# Patient Record
Sex: Male | Born: 1970
Health system: Southern US, Community
[De-identification: ages and names within clinical notes are randomized; demographics above are authoritative.]

## PROBLEM LIST (undated history)

## (undated) DIAGNOSIS — M545 Low back pain, unspecified: Secondary | ICD-10-CM

## (undated) DIAGNOSIS — Z789 Other specified health status: Secondary | ICD-10-CM

## (undated) DIAGNOSIS — R7989 Other specified abnormal findings of blood chemistry: Secondary | ICD-10-CM

## (undated) DIAGNOSIS — M21619 Bunion of unspecified foot: Secondary | ICD-10-CM

## (undated) DIAGNOSIS — M109 Gout, unspecified: Secondary | ICD-10-CM

## (undated) DIAGNOSIS — E669 Obesity, unspecified: Secondary | ICD-10-CM

## (undated) HISTORY — DX: Low back pain, unspecified: M54.50

## (undated) HISTORY — DX: Gout, unspecified: M10.9

## (undated) HISTORY — DX: Bunion of unspecified foot: M21.619

## (undated) HISTORY — DX: Obesity, unspecified: E66.9

## (undated) HISTORY — PX: KNEE SURGERY: SHX244

## (undated) HISTORY — DX: Other specified abnormal findings of blood chemistry: R79.89

---
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
## 1997-12-07 ENCOUNTER — Emergency Department (HOSPITAL_COMMUNITY): Admission: EM | Admit: 1997-12-07 | Discharge: 1997-12-07 | Payer: Self-pay | Admitting: Emergency Medicine

## 2016-06-30 DIAGNOSIS — Z0000 Encounter for general adult medical examination without abnormal findings: Secondary | ICD-10-CM | POA: Diagnosis not present

## 2016-07-11 DIAGNOSIS — M109 Gout, unspecified: Secondary | ICD-10-CM | POA: Diagnosis not present

## 2016-07-11 DIAGNOSIS — Z1389 Encounter for screening for other disorder: Secondary | ICD-10-CM | POA: Diagnosis not present

## 2016-07-11 DIAGNOSIS — Z0000 Encounter for general adult medical examination without abnormal findings: Secondary | ICD-10-CM | POA: Diagnosis not present

## 2017-01-02 DIAGNOSIS — H10502 Unspecified blepharoconjunctivitis, left eye: Secondary | ICD-10-CM | POA: Diagnosis not present

## 2017-08-14 DIAGNOSIS — R82998 Other abnormal findings in urine: Secondary | ICD-10-CM | POA: Diagnosis not present

## 2017-08-14 DIAGNOSIS — Z0000 Encounter for general adult medical examination without abnormal findings: Secondary | ICD-10-CM | POA: Diagnosis not present

## 2017-08-25 DIAGNOSIS — Z0000 Encounter for general adult medical examination without abnormal findings: Secondary | ICD-10-CM | POA: Diagnosis not present

## 2017-08-25 DIAGNOSIS — M109 Gout, unspecified: Secondary | ICD-10-CM | POA: Diagnosis not present

## 2017-08-25 DIAGNOSIS — Z125 Encounter for screening for malignant neoplasm of prostate: Secondary | ICD-10-CM | POA: Diagnosis not present

## 2017-08-25 DIAGNOSIS — Z1389 Encounter for screening for other disorder: Secondary | ICD-10-CM | POA: Diagnosis not present

## 2017-08-29 DIAGNOSIS — Z1212 Encounter for screening for malignant neoplasm of rectum: Secondary | ICD-10-CM | POA: Diagnosis not present

## 2019-03-07 ENCOUNTER — Emergency Department (HOSPITAL_COMMUNITY): Payer: 59

## 2019-03-07 ENCOUNTER — Encounter (HOSPITAL_COMMUNITY): Payer: Self-pay | Admitting: Emergency Medicine

## 2019-03-07 ENCOUNTER — Other Ambulatory Visit: Payer: Self-pay

## 2019-03-07 ENCOUNTER — Emergency Department (HOSPITAL_COMMUNITY)
Admission: EM | Admit: 2019-03-07 | Discharge: 2019-03-07 | Disposition: A | Discharge status: Home / Self Care | Payer: 59 | Attending: Emergency Medicine | Admitting: Emergency Medicine

## 2019-03-07 DIAGNOSIS — R200 Anesthesia of skin: Secondary | ICD-10-CM | POA: Diagnosis not present

## 2019-03-07 HISTORY — DX: Other specified health status: Z78.9

## 2019-03-07 LAB — CBC
HCT: 47.8 % (ref 39.0–52.0)
Hemoglobin: 16.1 g/dL (ref 13.0–17.0)
MCH: 31.7 pg (ref 26.0–34.0)
MCHC: 33.7 g/dL (ref 30.0–36.0)
MCV: 94.1 fL (ref 80.0–100.0)
Platelets: 266 10*3/uL (ref 150–400)
RBC: 5.08 MIL/uL (ref 4.22–5.81)
RDW: 12.2 % (ref 11.5–15.5)
WBC: 5.2 10*3/uL (ref 4.0–10.5)
nRBC: 0 % (ref 0.0–0.2)

## 2019-03-07 LAB — BASIC METABOLIC PANEL
Anion gap: 12 (ref 5–15)
BUN: 14 mg/dL (ref 6–20)
CO2: 27 mmol/L (ref 22–32)
Calcium: 9.6 mg/dL (ref 8.9–10.3)
Chloride: 103 mmol/L (ref 98–111)
Creatinine, Ser: 0.98 mg/dL (ref 0.61–1.24)
GFR calc Af Amer: >60 mL/min (ref >60)
GFR calc non Af Amer: >60 mL/min (ref >60)
Glucose, Bld: 89 mg/dL (ref 70–99)
Potassium: 4 mmol/L (ref 3.5–5.1)
Sodium: 142 mmol/L (ref 135–145)

## 2019-03-07 NOTE — ED Provider Notes (Signed)
I have personally seen and examined the patient. I have reviewed the documentation on PMH/FH/Soc Hx. I have discussed the plan of care with the resident and patient.  I have reviewed and agree with the resident's documentation. Please see associated encounter note.  Briefly, the patient is a 48 y.o. male here with left-sided facial numbness since yesterday.  Otherwise normal neurological exam.  Unremarkable vitals.  No significant medical history.  Does not take any medicine.  Does not have any stroke risk factors.  EKG shows sinus rhythm.  T wave versions inferiorly but no prior to compare to.  Does not have any chest pain.  Has a left-sided numbness in the lower face otherwise unremarkable exam.  Does not appear to be Bell's palsy.  No weakness.  No speech changes.  Lab work showed no significant anemia, electrolyte abnormality, kidney injury.  Head CT is unremarkable.  Will get MRI to rule out stroke.  Could be referred pain from tooth or from ear possibly.  Dispo per MRI.  This chart was dictated using voice recognition software.  Despite best efforts to proofread,  errors can occur which can change the documentation meaning.     EKG Interpretation  Date/Time:  Thursday March 07 2019 14:26:34 EST Ventricular Rate:  116 PR Interval:  148 QRS Duration: 90 QT Interval:  306 QTC Calculation: 425 R Axis:   100 Text Interpretation: Sinus tachycardia Rightward axis ST & T wave abnormality, consider inferolateral ischemia Abnormal ECG Confirmed by Lennice Sites [phone]) on 03/07/2019 3:28:56 PM         Lennice Sites, DO 03/07/19 1912

## 2019-03-07 NOTE — ED Notes (Signed)
Patient transported to MRI 

## 2019-03-07 NOTE — ED Provider Notes (Signed)
[facility] Provider Note   CSN: [id number] Arrival date & time: 03/07/19  1407     History Chief Complaint  Patient presents with  . Numbness    Jamie Parks is a 48 y.o. male.  HPI Pt is a 48 year old male with PMH of gout who presents to the ED with concern for left facial numbness.  Patient reports yesterday afternoon Jamie Parks was driving his car when Jamie Parks began to notice numbness in the left lower part of his face.  Patient denies any headache or pain associated with this.  No recent falls or trauma.  No recent dental procedures.  Patient has never had symptoms like this before.  Jamie Parks denies any vision changes.  Patient denies any acute speech difficulty.  No changes in taste or swallowing.  No ear pain.  Patient denies any numbness or weakness of his arms or legs.  No dizziness.  Patient states it feels as if Jamie Parks had a Novocain shot of the left side of his face start around the level of his ear and going down.  No prior history of stroke.  Patient has any chest pain or palpitations.  No difficulty breathing or recent illness.  No family history of stroke.  Patient does not smoke.  Past Medical History:  Diagnosis Date  . No pertinent past medical history     There are no problems to display for this patient.   History reviewed. No pertinent surgical history.     No family history on file.  Social History   Tobacco Use  . Smoking status: Not on file  Substance Use Topics  . Alcohol use: Not on file  . Drug use: Not on file    Home Medications Prior to Admission medications   Not on File    Allergies    Patient has no allergy information on record.  Review of Systems   Review of Systems  Constitutional: Negative for chills and fever.  HENT: Negative for dental problem, drooling, ear pain, mouth sores, sore throat, trouble swallowing and voice change.   Eyes: Negative for pain and visual disturbance.  Respiratory: Negative for  cough and shortness of breath.   Cardiovascular: Negative for chest pain and palpitations.  Gastrointestinal: Negative for abdominal pain and vomiting.  Genitourinary: Negative for dysuria and hematuria.  Musculoskeletal: Negative for arthralgias and back pain.  Skin: Negative for color change and rash.  Neurological: Positive for numbness. Negative for dizziness, seizures, syncope, weakness and headaches.  Psychiatric/Behavioral: Negative for agitation and behavioral problems.  All other systems reviewed and are negative.   Physical Exam Updated Vital Signs BP (!) 149/94 (BP Location: Right Arm)   Pulse (!) 103   Temp 98.5 F (36.9 C) (Oral)   Resp 16   Ht 6' 2.5" (1.892 m)   Wt 97.5 kg   SpO2 100%   BMI 27.24 kg/m   Physical Exam Vitals and nursing note reviewed.  Constitutional:      Appearance: Jamie Parks is well-developed.  HENT:     Head: Normocephalic and atraumatic.     Mouth/Throat:     Mouth: Mucous membranes are moist.     Pharynx: Oropharynx is clear. No oropharyngeal exudate or posterior oropharyngeal erythema.  Eyes:     Extraocular Movements: Extraocular movements intact.     Conjunctiva/sclera: Conjunctivae normal.     Pupils: Pupils are equal, round, and reactive to light.  Cardiovascular:     Rate and Rhythm: Normal  rate and regular rhythm.     Heart sounds: No murmur.  Pulmonary:     Effort: Pulmonary effort is normal. No respiratory distress.     Breath sounds: Normal breath sounds.  Abdominal:     Palpations: Abdomen is soft.     Tenderness: There is no abdominal tenderness.  Musculoskeletal:        General: Normal range of motion.     Cervical back: Neck supple.  Skin:    General: Skin is warm and dry.  Neurological:     Mental Status: Jamie Parks is alert and oriented to person, place, and time.     Sensory: Sensory deficit present.     Motor: No weakness.     Coordination: Coordination normal.     Gait: Gait normal.     Comments: Normal speech and  attention Visual fields intact  No facial asymmetry or weakness Decreased sensation to light touch to left lower face in V2-V3 distribution 5/5 strength throughout Gross sensation of extremities intact Ambulatory without difficulty   Psychiatric:        Mood and Affect: Mood normal.        Behavior: Behavior normal.     ED Results / Procedures / Treatments   Labs (all labs ordered are listed, but only abnormal results are displayed) Labs Reviewed  BASIC METABOLIC PANEL  CBC    EKG EKG Interpretation  Date/Time:  Thursday March 07 2019 14:26:34 EST Ventricular Rate:  116 PR Interval:  148 QRS Duration: 90 QT Interval:  306 QTC Calculation: 425 R Axis:   100 Text Interpretation: Sinus tachycardia Rightward axis ST & T wave abnormality, consider inferolateral ischemia Abnormal ECG Confirmed by Lennice Sites [phone]) on 03/07/2019 3:28:56 PM   Radiology CT Head Wo Contrast  Result Date: 03/07/2019 CLINICAL DATA:  Left-sided facial numbness onset last night EXAM: CT HEAD WITHOUT CONTRAST TECHNIQUE: Contiguous axial images were obtained from the base of the skull through the vertex without intravenous contrast. COMPARISON:  None. FINDINGS: Brain: No evidence of acute infarction, hemorrhage, hydrocephalus, extra-axial collection or mass lesion/mass effect. Vascular: No hyperdense vessel or unexpected calcification. Skull: Normal. Negative for fracture or focal lesion. Sinuses/Orbits: There is a partially imaged mucoid retention cyst of the right maxillary sinus. Total opacification of the right frontal sinus. Other: None. IMPRESSION: 1.  No acute intracranial pathology. 2. There is a partially imaged mucoid retention cyst of the right maxillary sinus. Total opacification of the right frontal sinus. Correlate for sinusitis. Electronically Signed   By: Eddie Candle M.D.   On: 03/07/2019 16:34   MR BRAIN WO CONTRAST  Result Date: 03/07/2019 CLINICAL DATA:  Numbness, tingling and  paresthesia is affecting the left face beginning yesterday. EXAM: MRI HEAD WITHOUT CONTRAST TECHNIQUE: Multiplanar, multiecho pulse sequences of the brain and surrounding structures were obtained without intravenous contrast. COMPARISON:  CT 03/07/2019 FINDINGS: Brain: The brain has a normal appearance without evidence of malformation, atrophy, old or acute small or large vessel infarction, mass lesion, hemorrhage, hydrocephalus or extra-axial collection. Vascular: Major vessels at the base of the brain show flow. Venous sinuses appear patent. Skull and upper cervical spine: Normal. Sinuses/Orbits: Opacification of the right division of the frontal sinus. Retention cyst in the right maxillary sinus. No fluid seen in the middle ears or mastoids. Orbits negative. Other: Asymmetric appearance of the left petrous apex which could represent either chronic sterile fluid collection or a cholesterol granuloma. The appearance is not likely to represent acute inflammatory disease. IMPRESSION: Normal  appearance of the brain itself. Likely incidental opacification of the right division of the frontal sinus and right maxillary sinus retention cyst. Asymmetric appearance of the petrous apex on the left which could be due to some chronic fluid accumulation or could represent a cholesterol granuloma. The appearance is not likely to represent acute inflammatory disease. This should not have any affect upon the left facial nerve. Electronically Signed   By: Nelson Chimes M.D.   On: 03/07/2019 20:56    Procedures Procedures (including critical care time)  Medications Ordered in ED Medications - No data to display  ED Course  I have reviewed the triage vital signs and the nursing notes.  Pertinent labs & imaging results that were available during my care of the patient were reviewed by me and considered in my medical decision making (see chart for details).    MDM Rules/Calculators/A&P On arrival, pt is afebrile,  hypertensive.   Patient is well-appearing on initial assessment.  Jamie Parks is ambulatory thought difficulty.  Patient does have decreased sensation of his left lower face in a V2/V3 distribution.  Intact sensation of upper face.  No evidence of facial asymmetry or weakness.  No other gross neuro deficits.  No visual deficits.  EKG: Sinus tachycardia with T wave inversions in inferior-lateral leads, no prior to compare   Considered: Peripheral cause such as peripheral lesion, vestibular schwannoma, Bell's palsy (no weakness or obvious CNVII involvement),TIA/CVA; less likely trigeminal neuralgia as patient does not have any associated pain.  Patient does have crown of his left inferior posterior molar without any surrounding swelling or erythema, denies any dental pain. Patient denies any ear pain or pain in general to suggest zoster.  CT head without acute findings MR brain: No evidence of infarct/mass/lesion; ? Cholesterol granuloma of petrous apex on left  Upon reassessment, patient continues to be well-appearing with isolated decrease sensation/paresthesias of his left.  Unclear etiology but given the above work-up, feel that patient is stable for outpatient follow-up.  Encouraged to follow-up with his PCP as well as his dentist.  Patient voices understanding.  Strict return precautions given.   Final Clinical Impression(s) / ED Diagnoses Final diagnoses:  Left facial numbness    Rx / DC Orders ED Discharge Orders    None       Burns Spain, MD 03/08/19 [place], [place], DO 03/10/19 [phone]

## 2019-03-07 NOTE — ED Triage Notes (Signed)
Pt reports driving yesterday and noticed the left side of his face feels like he has novocain in his cheek. Denies any weakness, trouble walking or eating. No facial droop.

## 2019-04-11 ENCOUNTER — Ambulatory Visit (INDEPENDENT_AMBULATORY_CARE_PROVIDER_SITE_OTHER): Payer: 59 | Admitting: Neurology

## 2019-04-11 ENCOUNTER — Encounter: Payer: Self-pay | Admitting: Neurology

## 2019-04-11 ENCOUNTER — Other Ambulatory Visit: Payer: Self-pay

## 2019-04-11 VITALS — BP 142/82 | HR 96 | Temp 97.5°F | Ht 74.5 in | Wt 229.0 lb

## 2019-04-11 DIAGNOSIS — R200 Anesthesia of skin: Secondary | ICD-10-CM | POA: Diagnosis not present

## 2019-04-11 DIAGNOSIS — M899 Disorder of bone, unspecified: Secondary | ICD-10-CM | POA: Diagnosis not present

## 2019-04-11 NOTE — Progress Notes (Addendum)
[facility]    Provider:  Dr Jaynee Eagles Requesting Provider: Tisovec, Fransico Him, MD Primary Care Provider:  Haywood Pao, MD  CC:  Left face numbness.   Addendum 04/14/2019: spoke with Dr Maree Erie, will repeat MRI: T1 with and without fat saturation. No contrast needed.  CGs are bright on T1 fat sat.  Effusions don't get bright. Spoke to ENT, Dr. Ernesto Rutherford, recommend Dr. Caryl Never at [facility] and I have places referral.   HPI:  Jamie Parks is a 49 y.o. male here as requested by Tisovec, Fransico Him, MD for facial paresthesias.  I reviewed emergency room notes which shows that patient was seen with left-sided facial numbness since the day prior, he was driving his car when he began to notice numbness in the left lower part of his face, no headache or pain associated, no recent falls or trauma or dental procedures, no vision changes or any acute speech difficulty, changes in taste or swallowing, ear pain, dizziness, but he felt as though he had a Novocain shot in the left side of his face starting on the level of his ear and going down, otherwise normal neurologic exam, no significant medical history, not on any medications, no stroke risk factors, EKG was sinus rhythm, no chest pain, reported left-sided numbness in the lower face otherwise exam was unremarkable, did not appear to be Bell's palsy, no speech changes, lab work showed no significant anemia, electrolyte abnormality, kidney injury, head CT was normal, MRI was ordered to rule out stroke, differential included referred pain from tooth from ear possibly.  They noted decreased sensation to light touch left lower face in the V2 V3 distribution otherwise normal examination.  MRI of the brain did show an asymmetric appearance of the left petrous apex which could represent either chronic sterile fluid collection or cholesterol granuloma, not likely to represent acute inflammatory disease, likely incidental  opacification of the right division of the frontal sinus and right maxillary sinus retention cyst.  He feels 70% novacaine wearing off, numbness, prickly, down the jaw line and maybe higher in the cheek, he uses afrin because he had his nose borken and he gets some congestion, no hx of recurrent sinus issues, headaches, ear infections, drainage. No other focal neurologic deficits, associated symptoms, inciting events or modifiable factors.  Reviewed notes, labs and imaging from outside physicians, which showed:  I reviewed MRI of the brain and agree there is a asymmetry along the left petrous apex. Also reviewed with patient. Also on the right there is maxillary and frontal sinus retention cyst.  Review of Systems: Patient complains of symptoms per HPI as well as the following symptoms : face numbness. Pertinent negatives and positives per HPI. All others negative.   Social History   Socioeconomic History  . Marital status: Married    Spouse name: Not on file  . Number of children: 2  . Years of education: Not on file  . Highest education level: Bachelor's degree (e.g., BA, AB, BS)  Occupational History  . Not on file  Tobacco Use  . Smoking status: Never Smoker  . Smokeless tobacco: Never Used  . Tobacco comment: parents smoked while he was young   Substance and Sexual Activity  . Alcohol use: Yes    Alcohol/week: 7.0 - 21.0 standard drinks    Types: 7 - 21 Standard drinks or equivalent per week    Comment: 1-3 drinks daily, bourbon or two and glass of wine with dinner.   Marland Kitchen  Drug use: Never  . Sexual activity: Not on file  Other Topics Concern  . Not on file  Social History Narrative   Lives at home with wife & kids   Left handed   Caffeine: coffee & tea. 1 venti americano per day and 16 oz of tea with lunch     Social Determinants of Health   Financial Resource Strain:   . Difficulty of Paying Living Expenses: Not on file  Food Insecurity:   . Worried About Ship broker in the Last Year: Not on file  . Ran Out of Food in the Last Year: Not on file  Transportation Needs:   . Lack of Transportation (Medical): Not on file  . Lack of Transportation (Non-Medical): Not on file  Physical Activity:   . Days of Exercise per Week: Not on file  . Minutes of Exercise per Session: Not on file  Stress:   . Feeling of Stress : Not on file  Social Connections:   . Frequency of Communication with Friends and Family: Not on file  . Frequency of Social Gatherings with Friends and Family: Not on file  . Attends Religious Services: Not on file  . Active Member of Clubs or Organizations: Not on file  . Attends Archivist Meetings: Not on file  . Marital Status: Not on file  Intimate Partner Violence:   . Fear of Current or Ex-Partner: Not on file  . Emotionally Abused: Not on file  . Physically Abused: Not on file  . Sexually Abused: Not on file    Family History  Problem Relation Age of Onset  . Hypertension Mother   . Healthy Brother   . Heart attack Maternal Grandfather   . Brain cancer Paternal Grandfather   . Healthy Brother     Past Medical History:  Diagnosis Date  . Bunion    or spur; Right foot  . Gout   . High serum high density lipoprotein (HDL)   . Low back pain    saw PT, better now.   . No pertinent past medical history   . Obesity     Patient Active Problem List   Diagnosis Date Noted  . Left facial numbness 04/14/2019    Past Surgical History:  Procedure Laterality Date  . KNEE SURGERY Left    in high school     Current Outpatient Medications  Medication Sig Dispense Refill  . allopurinol (ZYLOPRIM) 300 MG tablet Take 300 mg by mouth daily.    . predniSONE (STERAPRED UNI-PAK 21 TAB) 10 MG (21) TBPK tablet      No current facility-administered medications for this visit.    Allergies as of 04/11/2019  . (No Known Allergies)    Vitals: BP (!) 142/82 (BP Location: Left Arm, Patient Position: Sitting)    Pulse 96   Temp (!) 97.5 F (36.4 C) Comment: taken at front  Ht 6' 2.5" (1.892 m)   Wt 229 lb (103.9 kg)   BMI 29.01 kg/m  Last Weight:  Wt Readings from Last 1 Encounters:  04/11/19 229 lb (103.9 kg)   Last Height:   Ht Readings from Last 1 Encounters:  04/11/19 6' 2.5" (1.892 m)     Physical exam: Exam: Gen: NAD, conversant, well nourised, obese, well groomed                     CV: RRR, no MRG. No Carotid Bruits. No peripheral edema, warm, nontender Eyes:  Conjunctivae clear without exudates or hemorrhage  Neuro: Detailed Neurologic Exam  Speech:    Speech is normal; fluent and spontaneous with normal comprehension.  Cognition:    The patient is oriented to person, place, and time;     recent and remote memory intact;     language fluent;     normal attention, concentration,     fund of knowledge Cranial Nerves:    The pupils are equal, round, and reactive to light. The fundi are normal and spontaneous venous pulsations are present. Visual fields are full to finger confrontation. Extraocular movements are intact. Trigeminal sensation is impaired on the left V3. The face is symmetric. The palate elevates in the midline. Hearing intact. Voice is normal. Shoulder shrug is normal. The tongue has normal motion without fasciculations.   Coordination:    Normal finger to nose and heel to shin. Normal rapid alternating movements.   Gait:    Heel-toe and tandem gait are normal.   Motor Observation:    No asymmetry, no atrophy, and no involuntary movements noted. Tone:    Normal muscle tone.    Posture:    Posture is normal. normal erect    Strength:    Strength is V/V in the upper and lower limbs.      Sensation: intact to LT     Reflex Exam:  DTR's:    Deep tendon reflexes in the upper and lower extremities are normal bilaterally.   Toes:    The toes are downgoing bilaterally.   Clonus:    Clonus is absent.    Assessment/Plan:  Patient with acute onset  numbness left trigeminal nerve V3. I reviewed MRI of the brain and agree there is a asymmetry along the left petrous apex. Dr. Maree Erie differential includes fluid or cholesterol granuloma. In addition on the right there is maxillary and frontal sinus retention cyst.  - The trigeminal nerve can be affected in the setting of petrous apex disease, it is particularly susceptible to compression by adjacent disease processes.  I do think is likely what ever was seen on MRI is causing patient's numbness in the face.  I have sent an email to Dr. Maree Erie, neuroradiologist who read the report, to make an addendum if he agrees.  I am also awaiting Dr. Isabel Caprice response to see if MRI of the brain with thin cuts 3 of the IACs with and without contrast would be helpful to further delineate this lesion.  -  I am not very familiar with cholesterol granulomas, based on my research they are one of the most common lesions arising in the petrous apex.   I also spoke with Dr. Zada Finders at [facility] who has a specialty in skull based lesions and he states that we will probably have to send [facility] to [facility] or [facility] to find an ENT who would perform the surgery (this particular case is not in the neurosurgery realm and would be better with ENT).    Addendum 04/14/2019: spoke with Dr Maree Erie, will repeat MRI: T1 with and without fat saturation. No contrast needed.  CGs are bright on T1 fat sat.  Effusions don't get bright. Spoke to ENT, Dr. Ernesto Rutherford, recommend Dr[facility] at [facility] and I have places referral.   Cc: Tisovec, Fransico Him, MD,  Dr. Macarthur Critchley, MD  [facility] [address] [place], [place] [address]  Phone [phone] Fax [phone]  A total of 90 minutes was spent  on this patient's care, reviewing imaging, past records, recent hospitalization notes and results, discussing with other specialists. Over half this time was spent on counseling  patient on the  1. Skull lesion   2. Left facial numbness    diagnosis and different diagnostic and therapeutic options, counseling and coordination of care, risks and benefitsof management, compliance, or risk factor reduction and education.

## 2019-04-14 ENCOUNTER — Telehealth: Payer: Self-pay | Admitting: Neurology

## 2019-04-14 ENCOUNTER — Encounter: Payer: Self-pay | Admitting: Neurology

## 2019-04-14 ENCOUNTER — Other Ambulatory Visit: Payer: Self-pay | Admitting: Neurology

## 2019-04-14 DIAGNOSIS — H748X2 Other specified disorders of left middle ear and mastoid: Secondary | ICD-10-CM

## 2019-04-14 DIAGNOSIS — G509 Disorder of trigeminal nerve, unspecified: Secondary | ICD-10-CM

## 2019-04-14 DIAGNOSIS — R200 Anesthesia of skin: Secondary | ICD-10-CM | POA: Insufficient documentation

## 2019-04-14 DIAGNOSIS — M899 Disorder of bone, unspecified: Secondary | ICD-10-CM

## 2019-04-14 NOTE — Telephone Encounter (Signed)
I spoke with Dr. Maree Erie and he recommends repeating the MRI of the brain with a special protocol. I will place the order and see my last message to you about this so you can call and inform patient thanks  Per Dr. Maree Erie: Repeat MRI ,T1 with and without fat saturation. No contrast needed.  CGs are bright on T1 fat sat.  Effusions don't get bright.

## 2019-04-14 NOTE — Addendum Note (Signed)
Addended by: Sarina Ill B on: 04/14/2019 08:04 PM   Modules accepted: Orders

## 2019-04-14 NOTE — Telephone Encounter (Addendum)
Jamie Parks will you call patient and let him know that I did speak with Dr. Venetia Constable over in neurosurgery and after review he feels as though this particular case would be treated by ENT surgeons and that this would be their realm and not neurosurgery.  Dr. Venetia Constable also said he was not sure any ENT in this area would be specialized enough to address this issue and we will probably have to send patient to a [facility].  I spoke to ENT and they recommended Dr. Caryl Never at [facility] and I will place a referral for this, patient can look him up online and on youtube, he appears very accomplished.   Also let patient know I have reached out to Dr. Maree Erie who is the neuroradiologist who read his report, and he agrees that Cranial Nerve 5(facial numbness) can be caused by what we see on his MRI. Dr. Maree Erie also recommended additional MRI to see the lesion better(make sure it is a cholesterol granuloma and not something else like fluid) and I have gone ahead and ordered that for patient so we get all the imaging needed before sending him to [facility].   thanks

## 2019-04-14 NOTE — Addendum Note (Signed)
Addended by: Sarina Ill B on: 04/14/2019 08:52 PM   Modules accepted: Orders

## 2019-04-15 NOTE — Telephone Encounter (Signed)
I spoke with the patient and I explained to him the full message below from Dr. Jaynee Eagles. He verbalized understanding that he is being referred to Dr. Caryl Never, ENT doctor, at [facility]) in [place]. Pt also aware that another MRI has been ordered to get a better view of the lesion so we have all we need before sending him to [facility]. His questions were answered. He was concerned about claustrophobia with another MRI. I asked him to let us know if he decides he would like Dr. Jaynee Eagles to prescribe something for the MRI like Xanax for example. He verbalized appreciation for the call.

## 2019-04-19 ENCOUNTER — Ambulatory Visit: Payer: 59 | Attending: Internal Medicine

## 2019-04-19 DIAGNOSIS — Z20822 Contact with and (suspected) exposure to covid-19: Secondary | ICD-10-CM

## 2019-04-20 LAB — NOVEL CORONAVIRUS, NAA: SARS-CoV-2, NAA: NOT DETECTED

## 2019-04-29 ENCOUNTER — Telehealth: Payer: Self-pay

## 2019-04-29 NOTE — Telephone Encounter (Signed)
[employer] needed clinical notes to proceed with PA request. I have faxed notes to [phone] and received confirmation.

## 2019-04-30 ENCOUNTER — Other Ambulatory Visit: Payer: Self-pay

## 2019-04-30 ENCOUNTER — Telehealth: Payer: Self-pay

## 2019-04-30 ENCOUNTER — Ambulatory Visit: Payer: 59

## 2019-04-30 NOTE — Telephone Encounter (Signed)
Dr. Maree Erie, Sorry to bother you again but this patient we discussed not too long ago. I asked for him to be scheduled with [facility] and specifically mentioned to bring it to your attention since we spoke. 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 If we get him to the right place (back to you guys) will he have to pay for an entire MRI or can [facility] imaging provide the few extra images to further delineate this lesion (I think it was axial T2 fat sat you mentioned, only getting limited additional images) without charging him for the whole MRI since he already completed one? Thanks, so sorry for the bother!

## 2019-04-30 NOTE — Telephone Encounter (Signed)
The patient came in for MRI today for MRI Brain wo. There were additional sequences needed from a previous MRI Brain wo so we did not complete the exam here, in an attempt to schedule him at the original facility and just get those additional sequences. I called [facility] he had [facility] original scan and they stated that since its been two months the patient would have to have a new scan MRI Brain wo. Please advise if you would like the patient scheduled at the hospital, GI or back in our office since he will have to pay for imaging again?

## 2019-05-07 NOTE — Telephone Encounter (Signed)
When they call you back please let them know Dr. Maree Erie approved. If you have any problems come get me and I will talk to them too. thanks

## 2019-05-07 NOTE — Telephone Encounter (Signed)
I called [facility] to schedule, I was transferred 5 times, the last person I spoke with told me we could not schedule this without talking to a Freight forwarder. A message was taken for the manager, Corene Cornea, and she told me someone would call me back.

## 2019-05-07 NOTE — Telephone Encounter (Signed)
Kathy from GI called back and requested the patients information, I gave her the requested information, she said she would get back to [place].

## 2019-05-08 NOTE — Telephone Encounter (Signed)
I called the patient to provider apt information below but he did not answer so I left a VM asking him to call back. When he calls back please make him aware.

## 2019-05-08 NOTE — Telephone Encounter (Signed)
Sarah from GI called needing to discuss the order that was sent in for this pt. Please advise. [phone]. [phone]

## 2019-05-08 NOTE — Telephone Encounter (Signed)
Pt has called back and the date of 03-03 and time of 5pm was given to pt for the MRI @ [facility], pt is asking for a call from [facility] because he understood his MRI would be at another location, please call.

## 2019-05-08 NOTE — Telephone Encounter (Signed)
I returned the call to Judson Roch with [facility] who said that per Dr. Maree Erie the patient cannot have the imaging for free at [facility], it must be done at the hospital where he had the original scan.   I called [facility] for radiology. I spoke with Darl Householder, she stated that it had to be sent to a Freight forwarder. She said the manager would work on it and call back and let us know.

## 2019-05-08 NOTE — Telephone Encounter (Signed)
Jamie Parks from [facility] called in and stated she will go ahead and place patients appt in on Wednesday 05/29/2019 @ 4:30pm at [facility]

## 2019-05-09 NOTE — Telephone Encounter (Signed)
I called the patient back and spoke with him regarding the MRI and gave him the information below.

## 2019-05-29 ENCOUNTER — Other Ambulatory Visit: Payer: Self-pay

## 2019-05-29 ENCOUNTER — Ambulatory Visit (HOSPITAL_COMMUNITY)
Admission: RE | Admit: 2019-05-29 | Discharge: 2019-05-29 | Disposition: A | Discharge status: Home / Self Care | Payer: 59 | Source: Ambulatory Visit | Attending: Neurology | Admitting: Neurology

## 2019-05-29 DIAGNOSIS — R200 Anesthesia of skin: Secondary | ICD-10-CM | POA: Insufficient documentation

## 2019-05-29 DIAGNOSIS — M899 Disorder of bone, unspecified: Secondary | ICD-10-CM | POA: Diagnosis not present

## 2019-06-02 ENCOUNTER — Ambulatory Visit: Payer: 59 | Attending: Internal Medicine

## 2019-06-02 DIAGNOSIS — Z23 Encounter for immunization: Secondary | ICD-10-CM

## 2019-06-02 NOTE — Progress Notes (Signed)
   Covid-19 Vaccination Clinic  Name:  Jamie Parks    MRN: [id number] DOB: 1970/04/30  06/02/2019  [name] was observed post Covid-19 immunization for 15 minutes without incident. He was provided with Vaccine Information Sheet and instruction to access the V-Safe system.   [name] was instructed to call 911 with any severe reactions post vaccine: Marland Kitchen Difficulty breathing  . Swelling of face and throat  . A fast heartbeat  . A bad rash all over body  . Dizziness and weakness   Immunizations Administered    Name Date Dose VIS Date Route   Pfizer COVID-19 Vaccine 06/02/2019  2:57 PM 0.3 mL 03/08/2019 Intramuscular   Manufacturer: [place]   Lot: [id number]   [place]: [id number]

## 2019-06-03 ENCOUNTER — Telehealth: Payer: Self-pay | Admitting: Neurology

## 2019-06-03 NOTE — Telephone Encounter (Signed)
I returned the pt's call. LVM asking for call back or mychart message. Left office hours & number in message.

## 2019-06-03 NOTE — Telephone Encounter (Signed)
Pt called wanting to speak to RN about the referral to Dr. Owens Shark. Please advise.

## 2019-06-17 ENCOUNTER — Telehealth: Payer: Self-pay | Admitting: Neurology

## 2019-06-17 NOTE — Telephone Encounter (Signed)
Patient called and was very rude Screaming at me at the top of his lung saying I better have did his referral and I better not comprise his health and then stated that I better listen to him. I told the Patient that I was hanging up on him and That I would Not take him speaking to me that way .   I then called [facility] and they had him in the system but did not have him scheduled I relayed to [facility] He needed to be scheduled ASAP please Patient is scheduled 06/28/2019 with Dr. Vicie Mutters at 9:45 patient will have his hearing test and see Dr. Owens Shark afterwards .   I called back patient ser val times he did  need not answer I left him a voice mail with  Details of his apt . April 2 nd at 9:45 Hearing test after Hearing test he will see Dr. Vicie Mutters  [address] [place] [address] Telephone [phone]

## 2019-06-17 NOTE — Telephone Encounter (Signed)
Angie, please review for dismissal thans

## 2019-06-17 NOTE — Telephone Encounter (Signed)
error 

## 2019-06-18 NOTE — Telephone Encounter (Signed)
Called patient and relayed son his voice mail that he we need to go to [facility] to pick up his MRI CD and also provided him with where to go up up cd and there telephone number. Thanks Hinton Dyer.

## 2019-06-27 ENCOUNTER — Ambulatory Visit: Payer: Self-pay

## 2019-06-27 NOTE — Telephone Encounter (Signed)
Pt. Asking about quarantine and travel prior to second COVID 19 vaccine. Pt. [place] and has not been in contact with COVID 19 positive pt. That he is aware of. Per team lead Arbie Cookey Pullins, pt. Is clear for vaccine.

## 2019-07-02 ENCOUNTER — Ambulatory Visit: Payer: 59 | Attending: Internal Medicine

## 2019-07-02 DIAGNOSIS — Z23 Encounter for immunization: Secondary | ICD-10-CM

## 2019-07-02 NOTE — Progress Notes (Signed)
   Covid-19 Vaccination Clinic  Name:  BOAZ MOSKO    MRN: [id number] DOB: 1970-07-03  07/02/2019  Mr. Hartong was observed post Covid-19 immunization for 15 minutes without incident. He was provided with Vaccine Information Sheet and instruction to access the V-Safe system.   Mr. Hersh was instructed to call 911 with any severe reactions post vaccine: Marland Kitchen Difficulty breathing  . Swelling of face and throat  . A fast heartbeat  . A bad rash all over body  . Dizziness and weakness   Immunizations Administered    Name Date Dose VIS Date Route   Pfizer COVID-19 Vaccine 07/02/2019  1:23 PM 0.3 mL 03/08/2019 Intramuscular   Manufacturer: [place]   Lot: [id number]   [place]: [id number]

## 2019-09-23 ENCOUNTER — Ambulatory Visit (INDEPENDENT_AMBULATORY_CARE_PROVIDER_SITE_OTHER): Payer: 59 | Admitting: Psychology

## 2019-09-23 DIAGNOSIS — F4322 Adjustment disorder with anxiety: Secondary | ICD-10-CM

## 2019-10-07 ENCOUNTER — Ambulatory Visit (INDEPENDENT_AMBULATORY_CARE_PROVIDER_SITE_OTHER): Payer: 59 | Admitting: Psychology

## 2019-10-07 DIAGNOSIS — F4322 Adjustment disorder with anxiety: Secondary | ICD-10-CM

## 2019-10-28 ENCOUNTER — Ambulatory Visit (INDEPENDENT_AMBULATORY_CARE_PROVIDER_SITE_OTHER): Payer: No Typology Code available for payment source | Admitting: Psychology

## 2019-10-28 DIAGNOSIS — F4322 Adjustment disorder with anxiety: Secondary | ICD-10-CM | POA: Diagnosis not present

## 2019-11-11 ENCOUNTER — Ambulatory Visit (INDEPENDENT_AMBULATORY_CARE_PROVIDER_SITE_OTHER): Payer: No Typology Code available for payment source | Admitting: Psychology

## 2019-11-11 DIAGNOSIS — F4322 Adjustment disorder with anxiety: Secondary | ICD-10-CM | POA: Diagnosis not present

## 2019-11-25 ENCOUNTER — Ambulatory Visit (INDEPENDENT_AMBULATORY_CARE_PROVIDER_SITE_OTHER): Payer: No Typology Code available for payment source | Admitting: Psychology

## 2019-11-25 DIAGNOSIS — F4322 Adjustment disorder with anxiety: Secondary | ICD-10-CM | POA: Diagnosis not present

## 2019-12-16 ENCOUNTER — Ambulatory Visit: Payer: No Typology Code available for payment source | Admitting: Psychology

## 2020-07-21 IMAGING — MR MR HEAD W/O CM
5 of 7 series · 12 of 48 positions shown · non-contrast
Comparison: CT head 03/07/2019.  MRI head 03/07/2019

CLINICAL DATA: Left facial numbness. Trigeminal neuropathy.
Evaluate left petrous apex lesion on prior MRI.

EXAM:
MRI HEAD WITHOUT CONTRAST
TECHNIQUE: Multiplanar, multiecho pulse sequences of the brain and surrounding
structures were obtained without intravenous contrast.

[Series 4: T1 · axial · 3.0mm · 0.35mm/px · z∈[-44,+14]mm · 2 of 19 slices shown (1 of 2)]
[im 1/19]
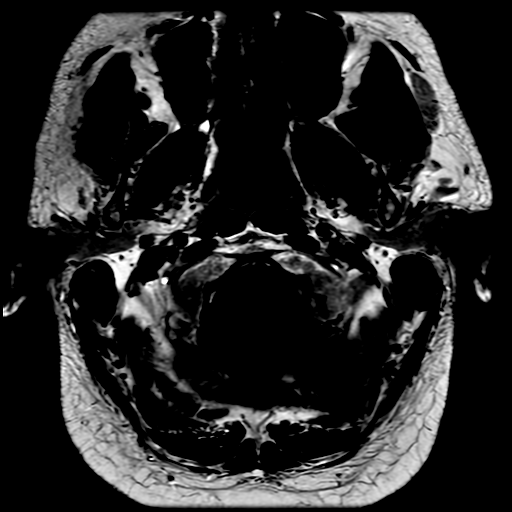
[im 19/19]
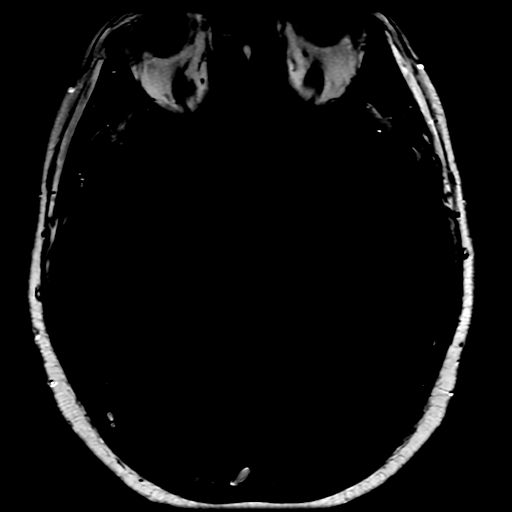

[Series 5: T1 fat-sat · axial · 3.0mm · 0.35mm/px · z∈[-44,+14]mm · 2 of 19 slices shown (1 of 2)]
[im 1/19]
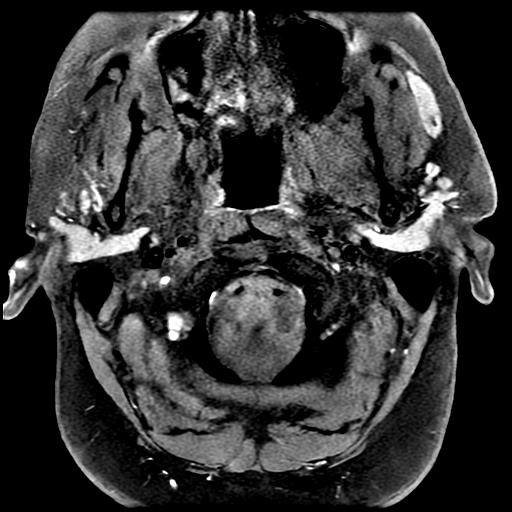
[im 19/19]
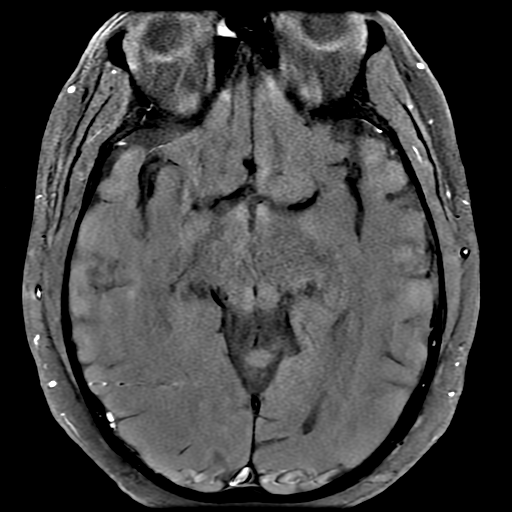

[Series 6: T1 · coronal · 3.0mm · 0.35mm/px · 2 of 19 slices shown (2 of 2)]
[im 1/19]
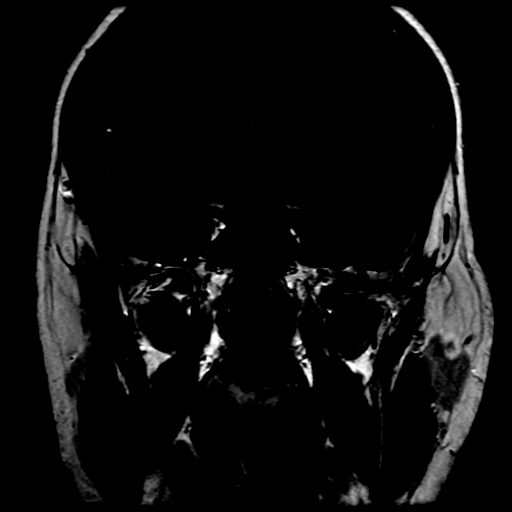
[im 19/19]
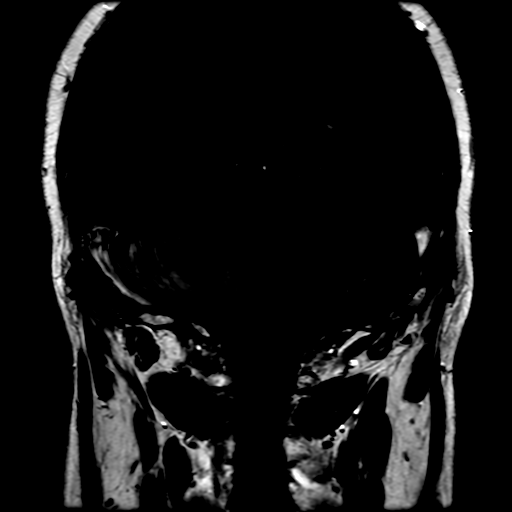

[Series 7: T1 fat-sat · coronal · 3.0mm · 0.35mm/px · 2 of 19 slices shown (2 of 2)]
[im 1/19]
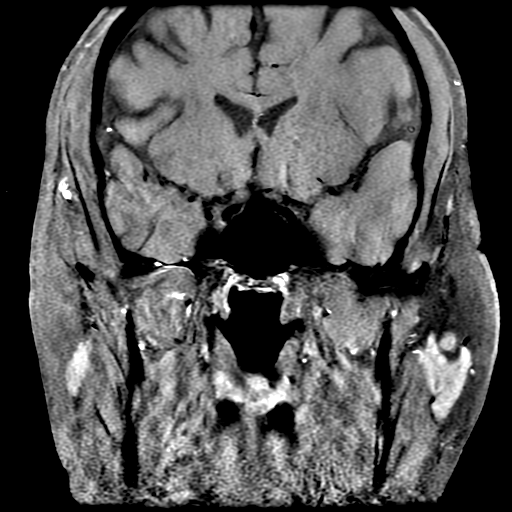
[im 19/19]
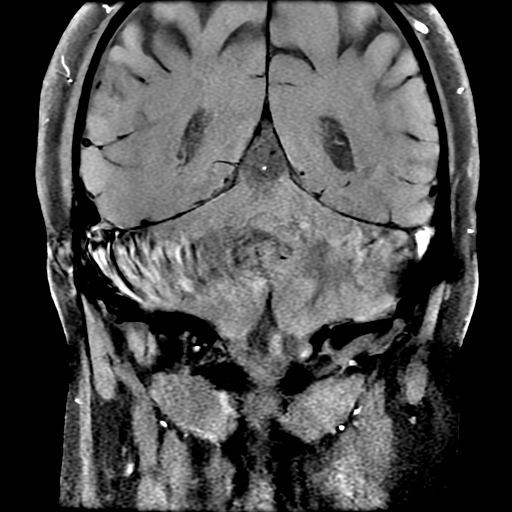

[Series 8: FLAIR · sagittal · 3.0mm · 0.47mm/px · 4 of 37 slices shown]
[im 1/37]
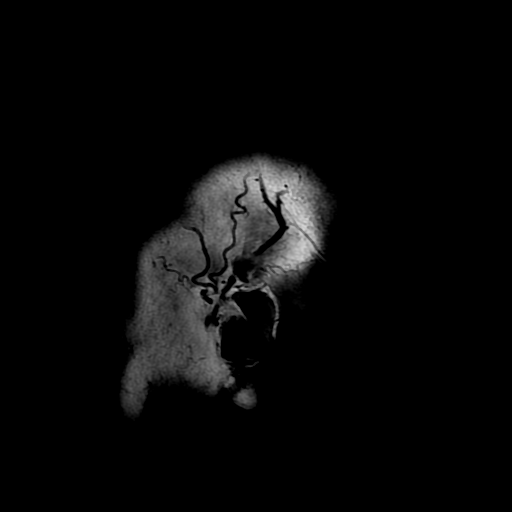
[im 13/37]
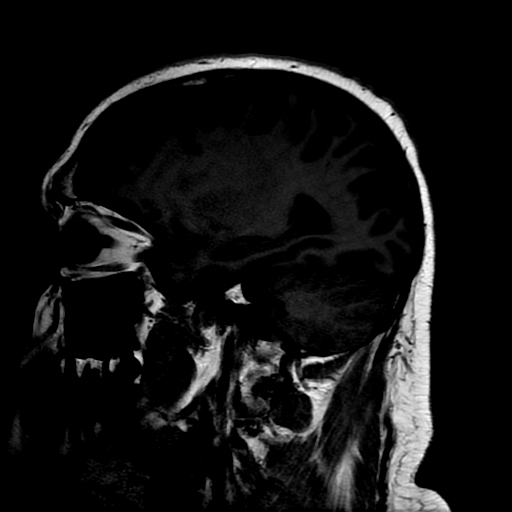
[im 25/37]
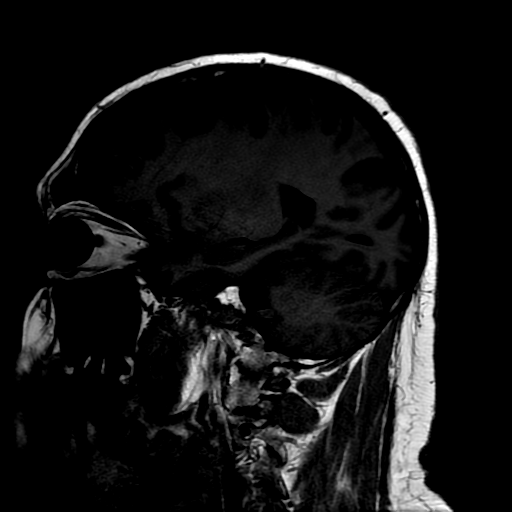
[im 37/37]
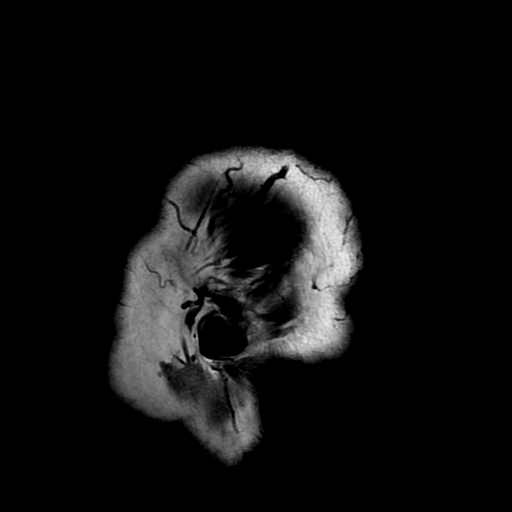

[12 of 48 positions shown; findings below may reference images not displayed]

FINDINGS: The patient was claustrophobic and was not able to complete all
sequences however thin-section T1 pre and post fat suppression was
obtained in axial and coronal images as well as axial thin-section
T2 images through the petrous apex.

T1 and T2 hyperintense lesion in the left petrous apex as noted
previously. This area measures approximately 13 mm in diameter. This
remains hyperintense following fat suppression. Therefore, this does
not represent fat or bone marrow. Review of the prior CT reveals no
significant expansion or bony destruction , a finding which would be
expected with cholesterol granuloma. Contrast not administered.
Remainder of the left mastoid sinus is clear. Right mastoid sinus
and petrous apex normal.

Cisternal segment of the trigeminal nerve is normal bilaterally. The
trigeminal ganglion is normal. The cavernous sinus is normal
bilaterally.

Retention cyst right maxillary sinus unchanged from the prior study.
IMPRESSION: 13 mm left petrous apex lesion does not represent simple fluid or
fat. Imaging characteristics not typical for cholesterol granuloma
due to lack of expansion and bony remodeling. Therefore, favor left
petrous apex effusion with proteinaceous material. No surrounding
inflammation to suggest acute infection.

## 2021-02-24 ENCOUNTER — Encounter: Payer: Self-pay | Admitting: Gastroenterology

## 2021-03-02 ENCOUNTER — Encounter: Payer: Self-pay | Admitting: Internal Medicine

## 2021-04-19 ENCOUNTER — Encounter: Payer: Self-pay | Admitting: Internal Medicine

## 2021-04-26 ENCOUNTER — Encounter: Payer: 59 | Admitting: Gastroenterology

## 2021-05-31 ENCOUNTER — Ambulatory Visit (AMBULATORY_SURGERY_CENTER): Payer: 59 | Admitting: *Deleted

## 2021-05-31 ENCOUNTER — Other Ambulatory Visit: Payer: Self-pay

## 2021-05-31 VITALS — Ht 75.0 in | Wt 220.0 lb

## 2021-05-31 DIAGNOSIS — Z1211 Encounter for screening for malignant neoplasm of colon: Secondary | ICD-10-CM

## 2021-05-31 MED ORDER — NA SULFATE-K SULFATE-MG SULF 17.5-3.13-1.6 GM/177ML PO SOLN: 1.0000 | Freq: Once | ORAL | 0 refills | Status: AC

## 2021-05-31 NOTE — Progress Notes (Signed)
No egg or soy allergy known to patient  ?No issues known to pt with past sedation with any surgeries or procedures ?Patient denies ever being told they had issues or difficulty with intubation  ?No FH of Malignant Hyperthermia ?Pt is not on diet pills ?Pt is not on  home 02  ?Pt is not on blood thinners  ?Pt denies issues with constipation  ?No A fib or A flutter ? ?Pt is  vaccinated  for Covid  ? ?Due to the COVID-19 pandemic we are asking patients to follow certain guidelines in PV and the [place]   ?Pt aware of COVID protocols and [facility] guidelines  ? ?PV completed over the phone. Pt verified name, DOB, address and insurance during PV today.  ?Pt mailed instruction packet with copy of consent form to read and not return, and instructions.  ?Pt encouraged to call with questions or issues.  ?If pt has My chart, procedure instructions sent via My Chart   ? ?Colonoscopy classification sheet mailed with packet. ?

## 2021-06-09 ENCOUNTER — Encounter: Payer: Self-pay | Admitting: Internal Medicine

## 2021-06-14 ENCOUNTER — Ambulatory Visit (AMBULATORY_SURGERY_CENTER): Payer: No Typology Code available for payment source | Admitting: Internal Medicine

## 2021-06-14 ENCOUNTER — Other Ambulatory Visit: Payer: Self-pay | Admitting: Internal Medicine

## 2021-06-14 ENCOUNTER — Other Ambulatory Visit: Payer: Self-pay

## 2021-06-14 ENCOUNTER — Encounter: Payer: Self-pay | Admitting: Internal Medicine

## 2021-06-14 VITALS — BP 118/85 | HR 77 | Temp 97.3°F | Resp 15 | Ht 74.0 in | Wt 220.0 lb

## 2021-06-14 DIAGNOSIS — D125 Benign neoplasm of sigmoid colon: Secondary | ICD-10-CM

## 2021-06-14 DIAGNOSIS — D128 Benign neoplasm of rectum: Secondary | ICD-10-CM

## 2021-06-14 DIAGNOSIS — Z1211 Encounter for screening for malignant neoplasm of colon: Secondary | ICD-10-CM

## 2021-06-14 DIAGNOSIS — D120 Benign neoplasm of cecum: Secondary | ICD-10-CM

## 2021-06-14 DIAGNOSIS — D127 Benign neoplasm of rectosigmoid junction: Secondary | ICD-10-CM | POA: Diagnosis not present

## 2021-06-14 DIAGNOSIS — D124 Benign neoplasm of descending colon: Secondary | ICD-10-CM

## 2021-06-14 MED ORDER — SODIUM CHLORIDE 0.9 % IV SOLN: 500.0000 mL | Freq: Once | INTRAVENOUS | Status: DC

## 2021-06-14 NOTE — Progress Notes (Signed)
VS- Jamie Parks  Pt's states no medical or surgical changes since previsit or office visit.  

## 2021-06-14 NOTE — Progress Notes (Signed)
VSS, transported to PACU °

## 2021-06-14 NOTE — Patient Instructions (Signed)
Handouts given for polyps and hemorrhoids.  Resume previous diet and continue current medications.  Await pathology results. ? ?YOU HAD AN ENDOSCOPIC PROCEDURE TODAY AT [facility]:   Refer to the procedure report that was given to you for any specific questions about what was found during the examination.  If the procedure report does not answer your questions, please call your gastroenterologist to clarify.  If you requested that your care partner not be given the details of your procedure findings, then the procedure report has been included in a sealed envelope for you to review at your convenience later. ? ?YOU SHOULD EXPECT: Some feelings of bloating in the abdomen. Passage of more gas than usual.  Walking can help get rid of the air that was put into your GI tract during the procedure and reduce the bloating. If you had a lower endoscopy (such as a colonoscopy or flexible sigmoidoscopy) you may notice spotting of blood in your stool or on the toilet paper. If you underwent a bowel prep for your procedure, you may not have a normal bowel movement for a few days. ? ?Please Note:  You might notice some irritation and congestion in your nose or some drainage.  This is from the oxygen used during your procedure.  There is no need for concern and it should clear up in a day or so. ? ?SYMPTOMS TO REPORT IMMEDIATELY: ? ?Following lower endoscopy (colonoscopy or flexible sigmoidoscopy): ? Excessive amounts of blood in the stool ? Significant tenderness or worsening of abdominal pains ? Swelling of the abdomen that is new, acute ? Fever of 100?F or higher ? ? ?For urgent or emergent issues, a gastroenterologist can be reached at any hour by calling [phone]. ?Do not use MyChart messaging for urgent concerns.  ? ? ?DIET:  We do recommend a small meal at first, but then you may proceed to your regular diet.  Drink plenty of fluids but you should avoid alcoholic beverages for 24 hours. ? ?ACTIVITY:   You should plan to take it easy for the rest of today and you should NOT DRIVE or use heavy machinery until tomorrow (because of the sedation medicines used during the test).   ? ?FOLLOW UP: ?Our staff will call the number listed on your records 48-72 hours following your procedure to check on you and address any questions or concerns that you may have regarding the information given to you following your procedure. If we do not reach you, we will leave a message.  We will attempt to reach you two times.  During this call, we will ask if you have developed any symptoms of COVID 19. If you develop any symptoms (ie: fever, flu-like symptoms, shortness of breath, cough etc.) before then, please call [phone].  If you test positive for Covid 19 in the 2 weeks post procedure, please call and report this information to [place].   ? ?If any biopsies were taken you will be contacted by phone or by letter within the next 1-3 weeks.  Please call us at [phone] if you have not heard about the biopsies in 3 weeks.  ? ? ?SIGNATURES/CONFIDENTIALITY: ?You and/or your care partner have signed paperwork which will be entered into your electronic medical record.  These signatures attest to the fact that that the information above on your After Visit Summary has been reviewed and is understood.  Full responsibility of the confidentiality of this discharge information lies with you and/or your care-partner.  ?

## 2021-06-14 NOTE — Progress Notes (Signed)
Called to room to assist during endoscopic procedure.  Patient ID and intended procedure confirmed with present staff. Received instructions for my participation in the procedure from the performing physician.  

## 2021-06-14 NOTE — Op Note (Signed)
[facility] ?Patient Name: Jamie Parks ?Procedure Date: 06/14/2021 8:21 AM ?MRN: [id number] ?Endoscopist: Jerene Bears , MD ?Age: 51 ?Referring MD:  ?Date of Birth: 1970-06-10 ?Gender: Male ?Account #: [id number] ?Procedure:                Colonoscopy ?Indications:              Screening for colorectal malignant neoplasm, This  ?                          is the patient's first colonoscopy ?Medicines:                Monitored Anesthesia Care ?Procedure:                Pre-Anesthesia Assessment: ?                          - Prior to the procedure, a History and Physical  ?                          was performed, and patient medications and  ?                          allergies were reviewed. The patient's tolerance of  ?                          previous anesthesia was also reviewed. The risks  ?                          and benefits of the procedure and the sedation  ?                          options and risks were discussed with the patient.  ?                          All questions were answered, and informed consent  ?                          was obtained. Prior Anticoagulants: The patient has  ?                          taken no previous anticoagulant or antiplatelet  ?                          agents. ASA Grade Assessment: II - A patient with  ?                          mild systemic disease. After reviewing the risks  ?                          and benefits, the patient was deemed in  ?                          satisfactory condition to undergo the procedure. ?  After obtaining informed consent, the colonoscope  ?                          was passed under direct vision. Throughout the  ?                          procedure, the patient's blood pressure, pulse, and  ?                          oxygen saturations were monitored continuously. The  ?                          CF [id number] #[id number] was introduced through the anus  ?                          and advanced to the cecum,  identified by  ?                          appendiceal orifice and ileocecal valve. The  ?                          colonoscopy was performed without difficulty. The  ?                          patient tolerated the procedure well. The quality  ?                          of the bowel preparation was good. The ileocecal  ?                          valve, appendiceal orifice, and rectum were  ?                          photographed. ?Scope In: 8:26:48 AM ?Scope Out: 8:40:16 AM ?Scope Withdrawal Time: 0 hours 11 minutes 7 seconds  ?Total Procedure Duration: 0 hours 13 minutes 28 seconds  ?Findings:                 The digital rectal exam was normal. ?                          A 5 mm polyp was found in the cecum. The polyp was  ?                          sessile. The polyp was removed with a cold snare.  ?                          Resection and retrieval were complete. ?                          A 4 mm polyp was found in the descending colon. The  ?                          polyp was sessile. The polyp was  removed with a  ?                          cold snare. Resection and retrieval were complete. ?                          Two sessile polyps were found in the sigmoid colon.  ?                          The polyps were 3 to 4 mm in size. These polyps  ?                          were removed with a cold snare. Resection and  ?                          retrieval were complete. ?                          A 3 mm polyp was found in the rectum. The polyp was  ?                          sessile. The polyp was removed with a cold snare.  ?                          Resection and retrieval were complete. ?                          Internal hemorrhoids were found during  ?                          retroflexion. The hemorrhoids were small. ?Complications:            No immediate complications. ?Estimated Blood Loss:     Estimated blood loss was minimal. ?Impression:               - One 5 mm polyp in the cecum, removed with a cold  ?                           snare. Resected and retrieved. ?                          - One 4 mm polyp in the descending colon, removed  ?                          with a cold snare. Resected and retrieved. ?                          - Two 3 to 4 mm polyps in the sigmoid colon,  ?                          removed with a cold snare. Resected and retrieved. ?                          - One  3 mm polyp in the rectum, removed with a cold  ?                          snare. Resected and retrieved. ?                          - Small internal hemorrhoids. ?Recommendation:           - Patient has a contact number available for  ?                          emergencies. The signs and symptoms of potential  ?                          delayed complications were discussed with the  ?                          patient. Return to normal activities tomorrow.  ?                          Written discharge instructions were provided to the  ?                          patient. ?                          - Resume previous diet. ?                          - Continue present medications. ?                          - Await pathology results. ?                          - Repeat colonoscopy is recommended. The  ?                          colonoscopy date will be determined after pathology  ?                          results from today's exam become available for  ?                          review. ?Jerene Bears, MD ?06/14/2021 8:45:36 AM ?This report has been signed electronically. ?

## 2021-06-14 NOTE — Progress Notes (Signed)
? ?GASTROENTEROLOGY PROCEDURE H&P NOTE  ? ?Primary Care Physician: ?Haywood Pao, MD ? ? ? ?Reason for Procedure:  Colon cancer screening ? ?Plan:    Colonoscopy ? ?Patient is appropriate for endoscopic procedure(s) in the ambulatory ([facility]) setting. ? ?The nature of the procedure, as well as the risks, benefits, and alternatives were carefully and thoroughly reviewed with the patient. Ample time for discussion and questions allowed. The patient understood, was satisfied, and agreed to proceed.  ? ? ? ?HPI: ?Jamie Parks is a 51 y.o. male who presents for colonoscopy.  Medical history as below.  Tolerated the prep.  No recent chest pain or shortness of breath.  No abdominal pain today. ? ?Past Medical History:  ?Diagnosis Date  ? Bunion   ? or spur; Right foot  ? Gout   ? Gout   ? High serum high density lipoprotein (HDL)   ? Low back pain   ? saw PT, better now.   ? No pertinent past medical history   ? Obesity   ? ? ?Past Surgical History:  ?Procedure Laterality Date  ? KNEE SURGERY Left   ? in high school and in [place],had surgeries twice  ? ? ?Prior to Admission medications   ?Medication Sig Start Date End Date Taking? Authorizing Provider  ?allopurinol (ZYLOPRIM) 300 MG tablet Take 300 mg by mouth daily. 02/16/19  Yes [provider]  ? ? ?Current Outpatient Medications  ?Medication Sig Dispense Refill  ? allopurinol (ZYLOPRIM) 300 MG tablet Take 300 mg by mouth daily.    ? ?Current Facility-Administered Medications  ?Medication Dose Route Frequency Provider Last Rate Last Admin  ? 0.9 %  sodium chloride infusion  500 mL Intravenous Once Mennie Spiller, Lajuan Lines, MD      ? ? ?Allergies as of 06/14/2021 - Review Complete 06/14/2021  ?Allergen Reaction Noted  ? Other  05/31/2021  ? ? ?Family History  ?Problem Relation Age of Onset  ? Hypertension Mother   ? Healthy Brother   ? Healthy Brother   ? Heart attack Maternal Grandfather   ? Brain cancer Paternal Grandfather   ? Colon cancer Neg Hx   ? Colon  polyps Neg Hx   ? Esophageal cancer Neg Hx   ? Rectal cancer Neg Hx   ? Stomach cancer Neg Hx   ? ? ?Social History  ? ?Socioeconomic History  ? Marital status: Married  ?  Spouse name: Not on file  ? Number of children: 2  ? Years of education: Not on file  ? Highest education level: Bachelor's degree (e.g., BA, AB, BS)  ?Occupational History  ? Not on file  ?Tobacco Use  ? Smoking status: Never  ?  Passive exposure: Past (as a kid,parents smoked)  ? Smokeless tobacco: Never  ? Tobacco comments:  ?  parents smoked while he was young   ?Vaping Use  ? Vaping Use: Never used  ?Substance and Sexual Activity  ? Alcohol use: Yes  ?  Alcohol/week: 7.0 - 21.0 standard drinks  ?  Types: 7 - 21 Standard drinks or equivalent per week  ?  Comment: 1-3 drinks daily, bourbon or two and glass of wine with dinner.   ? Drug use: Never  ? Sexual activity: Not on file  ?Other Topics Concern  ? Not on file  ?Social History Narrative  ? Lives at home with wife & kids  ? Left handed  ? Caffeine: coffee & tea. 1 venti americano per day and 16  oz of tea with lunch    ? ?Social Determinants of Health  ? ?Financial Resource Strain: Not on file  ?Food Insecurity: Not on file  ?Transportation Needs: Not on file  ?Physical Activity: Not on file  ?Stress: Not on file  ?Social Connections: Not on file  ?Intimate Partner Violence: Not on file  ? ? ?Physical Exam: ?Vital signs in last 24 hours: ?'@BP'$  (!) 157/96   Pulse 96   Temp (!) 97.3 ?F (36.3 ?C) (Skin)   Ht '6\' 2"'$  (1.88 m)   Wt 220 lb (99.8 kg)   SpO2 100%   BMI 28.25 kg/m?  ?GEN: NAD ?EYE: Sclerae anicteric ?ENT: MMM ?CV: Non-tachycardic ?Pulm: CTA b/l ?GI: Soft, NT/ND ?NEURO:  Alert & Oriented x 3 ? ? ?Zenovia Jarred, MD ?[facility] ? ?06/14/2021 8:15 AM ? ?

## 2021-06-16 ENCOUNTER — Telehealth: Payer: Self-pay | Admitting: *Deleted

## 2021-06-16 NOTE — Telephone Encounter (Signed)
Attempted to call patient for their post-procedure follow-up call. No answer. Left voicemail for patient to call us back if they have any questions or concerns. ?

## 2021-06-16 NOTE — Telephone Encounter (Signed)
No answer on first attempt follow up call. Left message.  ?

## 2021-06-17 ENCOUNTER — Encounter: Payer: Self-pay | Admitting: Internal Medicine
